# Patient Record
Sex: Male | Born: 1990 | Race: Black or African American | Hispanic: No | Marital: Single | Smoking: Former smoker
Health system: Southern US, Community
[De-identification: ages and names within clinical notes are randomized; demographics above are authoritative.]

---

## 2011-03-24 ENCOUNTER — Emergency Department (HOSPITAL_COMMUNITY): Payer: 59

## 2011-03-24 ENCOUNTER — Encounter (HOSPITAL_COMMUNITY): Payer: Self-pay | Admitting: Emergency Medicine

## 2011-03-24 ENCOUNTER — Emergency Department (HOSPITAL_COMMUNITY)
Admission: EM | Admit: 2011-03-24 | Discharge: 2011-03-24 | Disposition: A | Discharge status: Home Health | Payer: 59 | Attending: Emergency Medicine | Admitting: Emergency Medicine

## 2011-03-24 DIAGNOSIS — M25476 Effusion, unspecified foot: Secondary | ICD-10-CM | POA: Insufficient documentation

## 2011-03-24 DIAGNOSIS — Y9367 Activity, basketball: Secondary | ICD-10-CM | POA: Insufficient documentation

## 2011-03-24 DIAGNOSIS — M25473 Effusion, unspecified ankle: Secondary | ICD-10-CM | POA: Insufficient documentation

## 2011-03-24 DIAGNOSIS — X500XXA Overexertion from strenuous movement or load, initial encounter: Secondary | ICD-10-CM | POA: Insufficient documentation

## 2011-03-24 DIAGNOSIS — M25579 Pain in unspecified ankle and joints of unspecified foot: Secondary | ICD-10-CM | POA: Insufficient documentation

## 2011-03-24 DIAGNOSIS — R269 Unspecified abnormalities of gait and mobility: Secondary | ICD-10-CM | POA: Insufficient documentation

## 2011-03-24 DIAGNOSIS — S93409A Sprain of unspecified ligament of unspecified ankle, initial encounter: Secondary | ICD-10-CM | POA: Insufficient documentation

## 2011-03-24 MED ORDER — HYDROCODONE-ACETAMINOPHEN 5-325 MG PO TABS: 1.0000 | ORAL_TABLET | Freq: Four times a day (QID) | ORAL | Status: AC | PRN

## 2011-03-24 MED ORDER — HYDROCODONE-ACETAMINOPHEN 5-325 MG PO TABS
1.0000 | ORAL_TABLET | Freq: Once | ORAL | Status: AC
  Administered 2011-03-24: 1 via ORAL
  Filled 2011-03-24: qty 1

## 2011-03-24 MED ORDER — NAPROXEN 375 MG PO TABS: 375.0000 mg | ORAL_TABLET | Freq: Two times a day (BID) | ORAL | Status: AC

## 2011-03-24 MED ORDER — IBUPROFEN 800 MG PO TABS: 800.0000 mg | ORAL_TABLET | Freq: Once | ORAL | Status: DC

## 2011-03-24 MED ORDER — IBUPROFEN 800 MG PO TABS
800.0000 mg | ORAL_TABLET | Freq: Once | ORAL | Status: AC
  Administered 2011-03-24: 800 mg via ORAL
  Filled 2011-03-24: qty 1

## 2011-03-24 MED ORDER — NAPROXEN 375 MG PO TABS: 375.0000 mg | ORAL_TABLET | Freq: Two times a day (BID) | ORAL | Status: DC

## 2011-03-24 NOTE — Discharge Instructions (Signed)
Be sure to read and understand instructions below prior to leaving the hospital. If your symptoms persist without any improvement in 1 week it is reccommended that you follow up with orthopedics listed above. Use your pain medication as prescribed and do not operate heavy machinery while on pain medication. Note that your pain medication contains acetaminophen (Tylenol) & its is not reccommended that you use additional acetaminophen (Tylenol) while taking this medication.  Ankle Sprain  An ankle sprain is an injury to the ligaments that hold the ankle joint together. Your X-ray today showed no evidence of fracture, however keep all follow-up appointments with an orthopedic specialist to have follow-up X-rays, because as we discussed fractures may not appear until 3 days after the acute injury.    TREATMENT  Rest, ice, elevation, and compression are the basic modes of treatment.    HOME CARE INSTRUCTIONS  Apply ice to the sore area for 15 to 20 minutes, 3 to 4 times per day. Do this while you are awake for the first 2 days, or as directed. This can be stopped when the swelling goes away. Put the ice in a plastic bag and place a towel between the bag of ice and your skin.  Keep your leg elevated when possible to lessen swelling.  If your caregiver recommends crutches, use them as instructed for 1 week. Then, you may walk on your ankle weight bearing as tolerated.  You may take off your ankle stabilizer at night and to take a shower or bath. Wiggle your toes in the splint several times per day if you are able.  Do not drive a vehicle on pain medication. ACTIVITY:            - Weight bearing as tolerated            - Exercises should be limited to pain free range of motion            - Can start mobilization by tracing the alphabet with your foot in the air.       SEEK MEDICAL CARE IF:  You have an increase in bruising, swelling, or pain.  Your toes feel cold.  Pain relief is not achieved with  medications.  EMERGENCY:: Your toes are numb or blue or you have severe pain.  MAKE SURE YOU:  Understand these instructions.  Will watch your condition.  Will get help right away if you are not doing well or get worse   COLD THERAPY DIRECTIONS:  Ice or gel packs can be used to reduce both pain and swelling. Ice is the most helpful within the first 24 to 48 hours after an injury or flareup from overusing a muscle or joint.  Ice is effective, has very few side effects, and is safe for most people to use.   If you expose your skin to cold temperatures for too long or without the proper protection, you can damage your skin or nerves. Watch for signs of skin damage due to cold.   HOME CARE INSTRUCTIONS  Follow these tips to use ice and cold packs safely.  Place a dry or damp towel between the ice and skin. A damp towel will cool the skin more quickly, so you may need to shorten the time that the ice is used.  For a more rapid response, add gentle compression to the ice.  Ice for no more than 10 to 20 minutes at a time. The bonier the area you are icing, the less   time it will take to get the benefits of ice.  Check your skin after 5 minutes to make sure there are no signs of a poor response to cold or skin damage.  Rest 20 minutes or more in between uses.  Once your skin is numb, you can end your treatment. You can test numbness by very lightly touching your skin. The touch should be so light that you do not see the skin dimple from the pressure of your fingertip. When using ice, most people will feel these normal sensations in this order: cold, burning, aching, and numbness.  Do not use ice on someone who cannot communicate their responses to pain, such as small children or people with dementia.   HOW TO MAKE AN ICE PACK  To make an ice pack, do one of the following:  Place crushed ice or a bag of frozen vegetables in a sealable plastic bag. Squeeze out the excess air. Place this bag inside  another plastic bag. Slide the bag into a pillowcase or place a damp towel between your skin and the bag.  Mix 3 parts water with 1 part rubbing alcohol. Freeze the mixture in a sealable plastic bag. When you remove the mixture from the freezer, it will be slushy. Squeeze out the excess air. Place this bag inside another plastic bag. Slide the bag into a pillowcase or place a damp towel between your sk 

## 2011-03-24 NOTE — ED Notes (Signed)
Pt states that he came down on his ankle while playing basketball today and thinks he may have injured it

## 2011-03-24 NOTE — ED Provider Notes (Signed)
History     CSN: [id number]  Arrival date & time 03/24/11  1902   First MD Initiated Contact with Patient 03/24/11 2009      Chief Complaint  Patient presents with  . Ankle Injury    (Consider location/radiation/quality/duration/timing/severity/associated sxs/prior treatment) HPI Comments: Patient no medical history presents emergency department with chief complaint of right-sided ankle pain.  Onset occurred earlier today around 7 in the evening.  The pain is described as a throbbing, does not radiate, is rated at a 8/10 in severity and is not have any associated symptoms.  Patient states pain with weightbearing but denies numbness and tingling of the extremity.  Patient was playing basketball earlier today when he jumped for shot rolling his ankle, mechanism was inversion.  Patient is alert this time.  Patient is a 21 y.o. male presenting with lower extremity injury. The history is provided by the patient.  Ankle Injury Associated symptoms include arthralgias. Pertinent negatives include no abdominal pain, chest pain, chills, congestion, fever, headaches, numbness or weakness.    History reviewed. No pertinent past medical history.  History reviewed. No pertinent past surgical history.  No family history on file.  History  Substance Use Topics  . Smoking status: Not on file  . Smokeless tobacco: Not on file  . Alcohol Use: Not on file      Review of Systems  Constitutional: Negative for fever, chills and appetite change.  HENT: Negative for congestion.   Eyes: Negative for visual disturbance.  Respiratory: Negative for shortness of breath.   Cardiovascular: Negative for chest pain and leg swelling.  Gastrointestinal: Negative for abdominal pain.  Genitourinary: Negative for dysuria, urgency and frequency.  Musculoskeletal: Positive for arthralgias and gait problem.  Neurological: Negative for dizziness, syncope, weakness, light-headedness, numbness and headaches.    Psychiatric/Behavioral: Negative for confusion.    Allergies  Review of patient's allergies indicates no known allergies.  Home Medications  No current outpatient prescriptions on file.  BP 158/69  Pulse 76  Temp 98.8 F (37.1 C)  Resp 18  SpO2 100%  Physical Exam  Nursing note and vitals reviewed. Constitutional: He is oriented to person, place, and time. He appears well-developed and well-nourished. No distress.  HENT:  Head: Normocephalic and atraumatic.  Eyes: Conjunctivae and EOM are normal.  Neck: Normal range of motion.  Pulmonary/Chest: Effort normal.  Musculoskeletal:       Right ankle: He exhibits decreased range of motion and swelling. He exhibits no ecchymosis, no laceration and normal pulse. tenderness. Lateral malleolus and medial malleolus tenderness found. No AITFL, no CF ligament, no posterior TFL, no head of 5th metatarsal and no proximal fibula tenderness found. Achilles tendon normal.       Feet:       Intact distal pulses, decreased range of motion of right ankle due to pain.  No pain with passive inversion or eversion of foot.  Tenderness to palpation along the medial malleolus and posterior lateral malleolus.  Mild swelling, no contusions.  Neurological: He is alert and oriented to person, place, and time.  Skin: Skin is warm and dry. No rash noted. He is not diaphoretic.  Psychiatric: He has a normal mood and affect. His behavior is normal.    ED Course  Procedures (including critical care time)  Labs Reviewed - No data to display Dg Ankle Complete Right  03/24/2011  *RADIOLOGY REPORT*  Clinical Data: Twisted with medial pain  RIGHT ANKLE - COMPLETE 3+ VIEW  Comparison: None.  Findings:  There is a joint effusion.  No evidence of fracture or dislocation.  IMPRESSION: No bony abnormality.  Joint effusion.  Original Report Authenticated By: Thomasenia Sales, M.D.     No diagnosis found.    MDM  Ankle sprain  Patient X-Ray negative for obvious  fracture or dislocation. Pain managed in ED. Pt advised to follow up with orthopedics if symptoms persist for possibility of missed fracture diagnosis. Patient given brace while in ED, conservative therapy recommended and discussed. Patient will be dc home & is agreeable with above plan.         Jaci Carrel, [place] 03/24/11 2038

## 2011-04-09 NOTE — ED Provider Notes (Signed)
Medical screening examination/treatment/procedure(s) were performed by non-physician practitioner and as supervising physician I was immediately available for consultation/collaboration.  Raeford Razor, MD 04/09/11 [phone]

## 2015-05-29 ENCOUNTER — Emergency Department (HOSPITAL_BASED_OUTPATIENT_CLINIC_OR_DEPARTMENT_OTHER): Payer: BLUE CROSS/BLUE SHIELD

## 2015-05-29 ENCOUNTER — Encounter (HOSPITAL_BASED_OUTPATIENT_CLINIC_OR_DEPARTMENT_OTHER): Payer: Self-pay

## 2015-05-29 ENCOUNTER — Emergency Department (HOSPITAL_BASED_OUTPATIENT_CLINIC_OR_DEPARTMENT_OTHER)
Admission: EM | Admit: 2015-05-29 | Discharge: 2015-05-29 | Disposition: A | Discharge status: Home / Self Care | Payer: BLUE CROSS/BLUE SHIELD | Attending: Emergency Medicine | Admitting: Emergency Medicine

## 2015-05-29 DIAGNOSIS — M25532 Pain in left wrist: Secondary | ICD-10-CM | POA: Diagnosis not present

## 2015-05-29 DIAGNOSIS — Z87891 Personal history of nicotine dependence: Secondary | ICD-10-CM | POA: Diagnosis not present

## 2015-05-29 NOTE — ED Provider Notes (Signed)
CSN: [id number]     Arrival date & time 05/29/15  1307 History   First MD Initiated Contact with Patient 05/29/15 1337     Chief Complaint  Patient presents with  . Wrist Injury     Patient is a 25 y.o. male presenting with wrist injury. The history is provided by the patient. No language interpreter was used.  Wrist Injury  Timothy Diaz is a 25 y.o. male who presents to the Emergency Department complaining of wrist pain.  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 He reports 1 month of waxing and waning left wrist pain. He has occasional pain to his left dorsal wrist. Over the last week he's noticed a bump on the back of his wrist. No fevers, injuries, numbness, weakness. Symptoms are mild and worsening in nature.  History reviewed. No pertinent past medical history. History reviewed. No pertinent past surgical history. No family history on file. Social History  Substance Use Topics  . Smoking status: Former Games developermoker  . Smokeless tobacco: None  . Alcohol Use: No    Review of Systems  All other systems reviewed and are negative.     Allergies  Review of patient's allergies indicates no known allergies.  Home Medications   Prior to Admission medications   Not on File   BP 151/60 mmHg  Pulse 64  Temp(Src) 97.9 F (36.6 C) (Oral)  Resp 16  Ht 6\' 2"  (1.88 m)  Wt 210 lb (95.255 kg)  BMI 26.95 kg/m2  SpO2 100% Physical Exam  Constitutional: He is oriented to person, place, and time. He appears well-developed and well-nourished. No distress.  HENT:  Head: Normocephalic and atraumatic.  Cardiovascular: Normal rate.   Pulmonary/Chest: Effort normal. No respiratory distress.  Musculoskeletal:  2+ radial pulses. There is a firm nodule on the left dorsal wrist that is prominent with flexion of the wrists. No erythema or significant tenderness to the wrist. Full range of motion throughout the wrist and hands.  Neurological: He is alert and oriented  to person, place, and time.  5 out of 5 grip strength bilaterally with sensation to light touch intact throughout bilateral upper extremities  Skin: Skin is warm and dry.  Psychiatric: He has a normal mood and affect. His behavior is normal.  Nursing note and vitals reviewed.   ED Course  Procedures (including critical care time) Labs Review Labs Reviewed - No data to display  Imaging Review Dg Wrist Complete Left  05/29/2015  CLINICAL DATA:  Knot on posterior wrist for a month, no known injury. Intermittent pain. EXAM: LEFT WRIST - COMPLETE 3+ VIEW COMPARISON:  None. FINDINGS: Osseous structures of the left wrist are normally aligned. Bone mineralization is normal. No acute or suspicious osseous lesion. No fracture line or displaced fracture fragment. Adjacent soft tissues are unremarkable. IMPRESSION: Negative. Electronically Signed   By: Bary RichardStan  Maynard M.D.   On: 05/29/2015 13:43   I have personally reviewed and evaluated these images and lab results as part of my medical decision-making.   EKG Interpretation None      MDM   Final diagnoses:  Left wrist pain  Patient here for evaluation of left wrist pain for the last week. No evidence of acute fracture, dislocation, infection. Patient with likely overuse injury from repetitive work. Will provide splint to use while working to rest his wrist. Discussed ibuprofen, home care, outpatient follow-up, return precautions.  Tilden FossaElizabeth Alexee Delsanto, MD 05/29/15 [phone]

## 2015-05-29 NOTE — ED Notes (Signed)
Pt reports pain to L wrist without injury x 1 week.

## 2015-05-29 NOTE — Discharge Instructions (Signed)
You may use the removable wrist splint while at work. You can take ibuprofen, available over-the-counter as needed for pain.   Joint Pain Joint pain, which is also called arthralgia, can be caused by many things. Joint pain often goes away when you follow your health care provider's instructions for relieving pain at home. However, joint pain can also be caused by conditions that require further treatment. Common causes of joint pain include:  Bruising in the area of the joint.  Overuse of the joint.  Wear and tear on the joints that occur with aging (osteoarthritis).  Various other forms of arthritis.  A buildup of a crystal form of uric acid in the joint (gout).  Infections of the joint (septic arthritis) or of the bone (osteomyelitis). Your health care provider may recommend medicine to help with the pain. If your joint pain continues, additional tests may be needed to diagnose your condition. HOME CARE INSTRUCTIONS Watch your condition for any changes. Follow these instructions as directed to lessen the pain that you are feeling.  Take medicines only as directed by your health care provider.  Rest the affected area for as long as your health care provider says that you should. If directed to do so, raise the painful joint above the level of your heart while you are sitting or lying down.  Do not do things that cause or worsen pain.  If directed, apply ice to the painful area:  Put ice in a plastic bag.  Place a towel between your skin and the bag.  Leave the ice on for 20 minutes, 2-3 times per day.  Wear an elastic bandage, splint, or sling as directed by your health care provider. Loosen the elastic bandage or splint if your fingers or toes become numb and tingle, or if they turn cold and blue.  Begin exercising or stretching the affected area as directed by your health care provider. Ask your health care provider what types of exercise are safe for you.  Keep all  follow-up visits as directed by your health care provider. This is important. SEEK MEDICAL CARE IF:  Your pain increases, and medicine does not help.  Your joint pain does not improve within 3 days.  You have increased bruising or swelling.  You have a fever.  You lose 10 lb (4.5 kg) or more without trying. SEEK IMMEDIATE MEDICAL CARE IF:  You are not able to move the joint.  Your fingers or toes become numb or they turn cold and blue.   This information is not intended to replace advice given to you by your health care provider. Make sure you discuss any questions you have with your health care provider.   Document Released: 12/21/2004 Document Revised: 01/11/2014 Document Reviewed: 10/02/2013 Elsevier Interactive Patient Education [employer]2016 [employer]

## 2017-03-04 IMAGING — DX DG WRIST COMPLETE 3+V*L*
4 series · 4 of 4 positions shown · non-contrast
Comparison: None.

CLINICAL DATA: Knot on posterior wrist for a month, no known
injury. Intermittent pain.

EXAM:
LEFT WRIST - COMPLETE 3+ VIEW

[wrist pa]
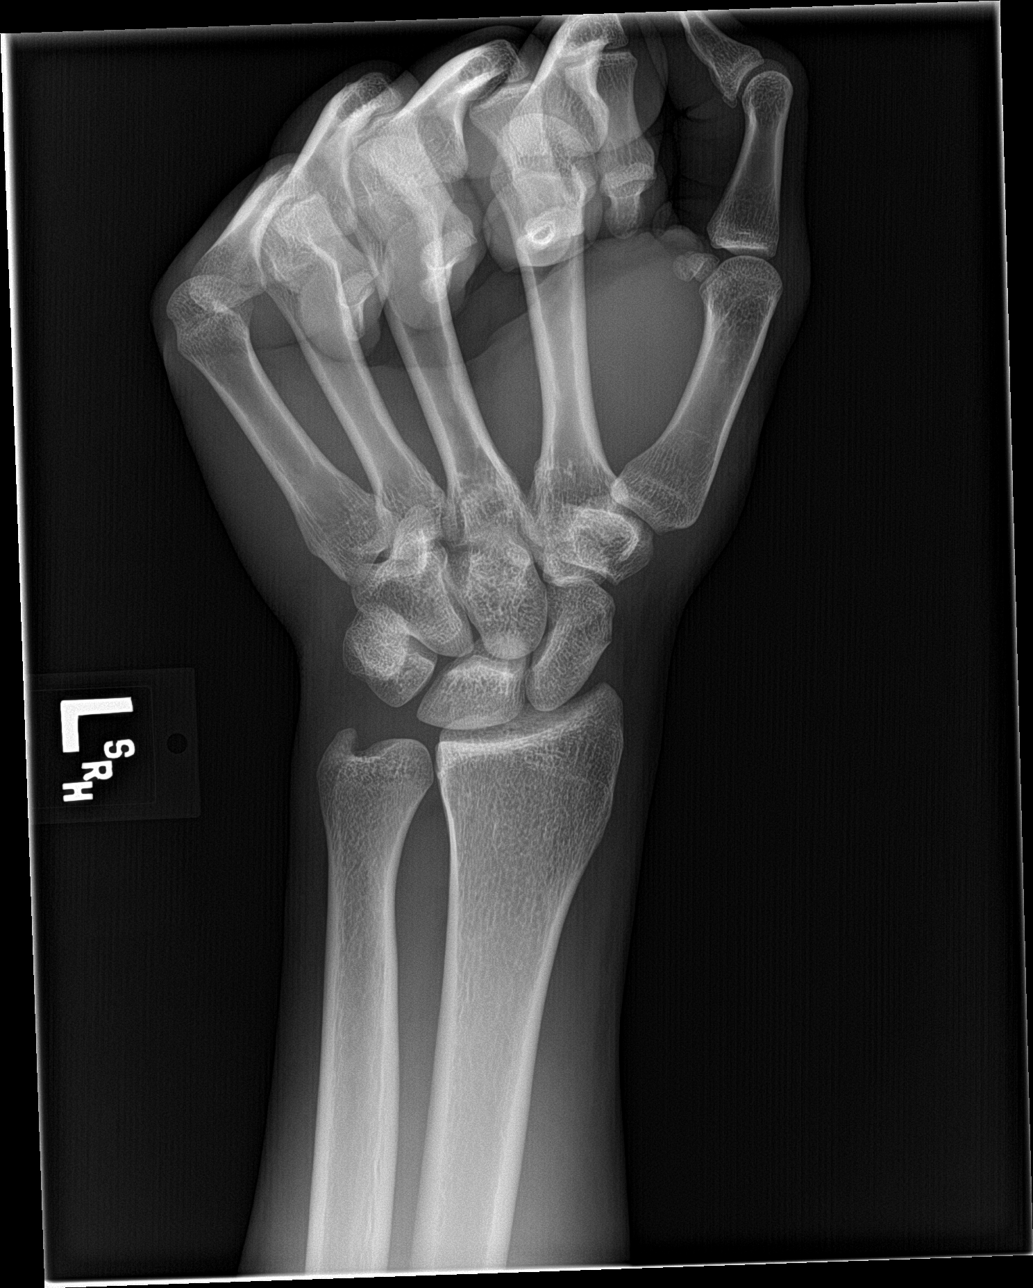

[wrist obl]
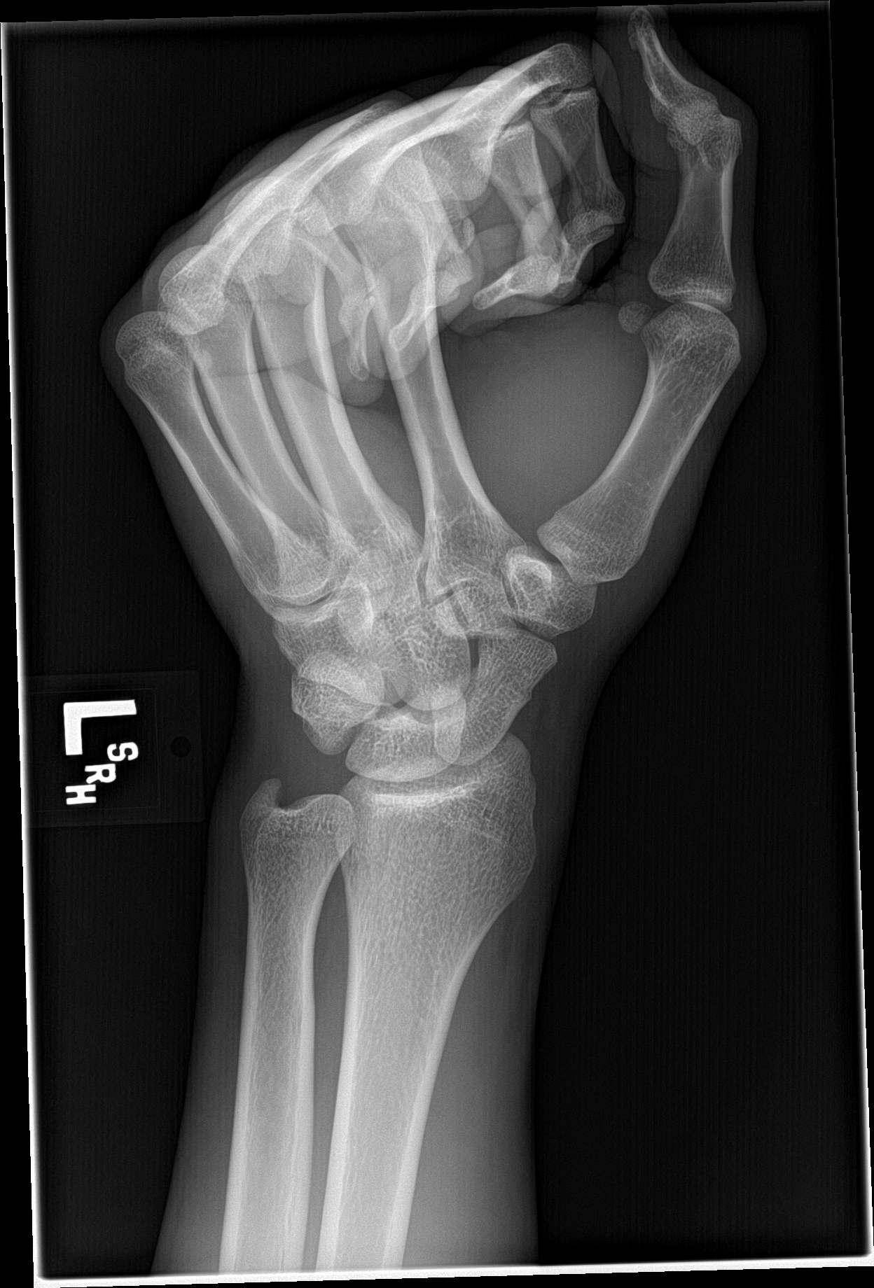

[wrist lat]
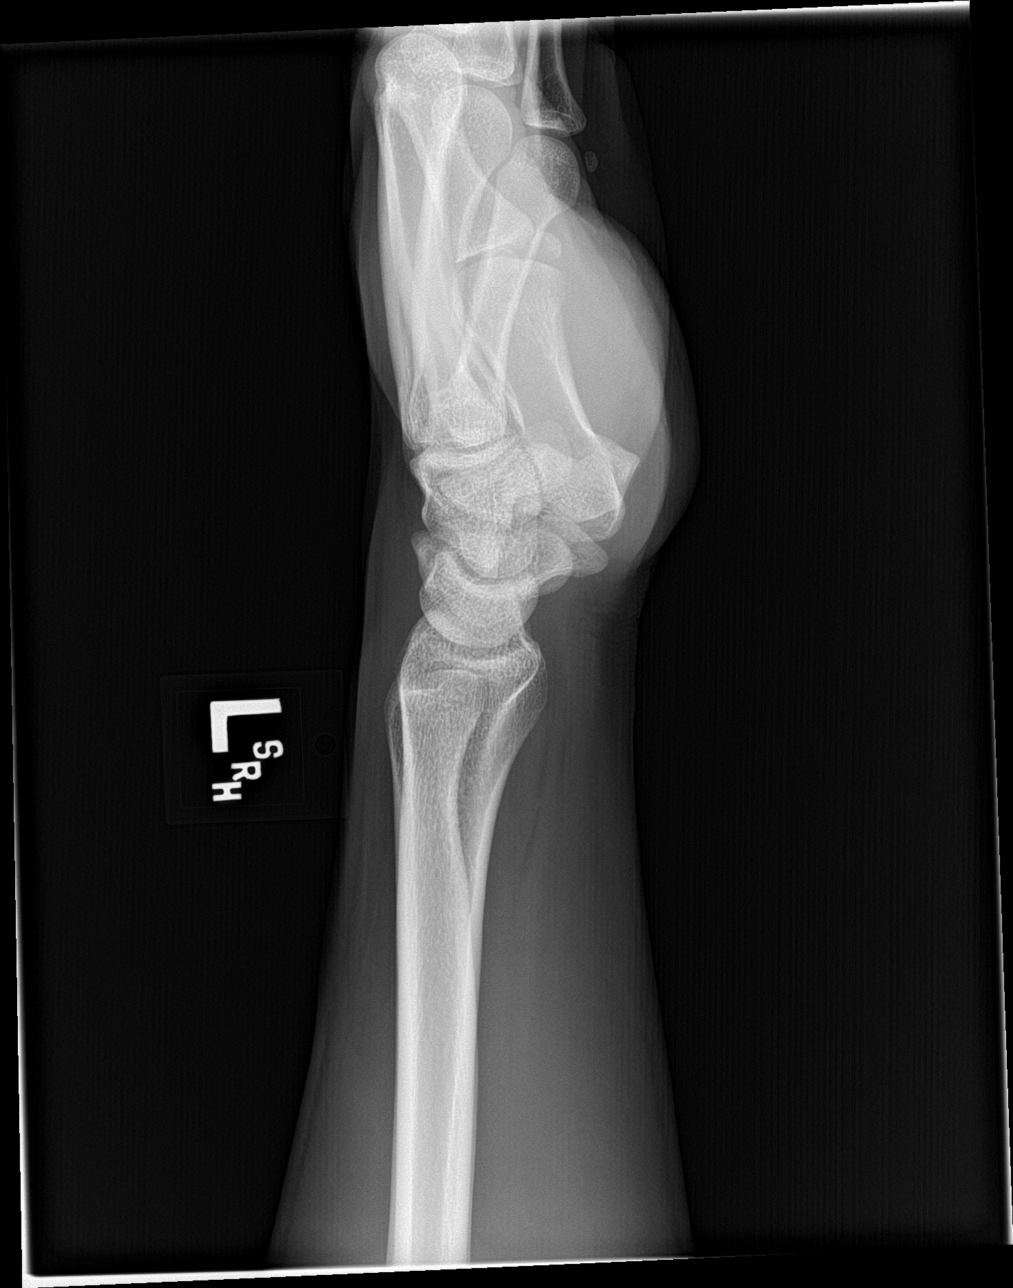

[wrist navicular]
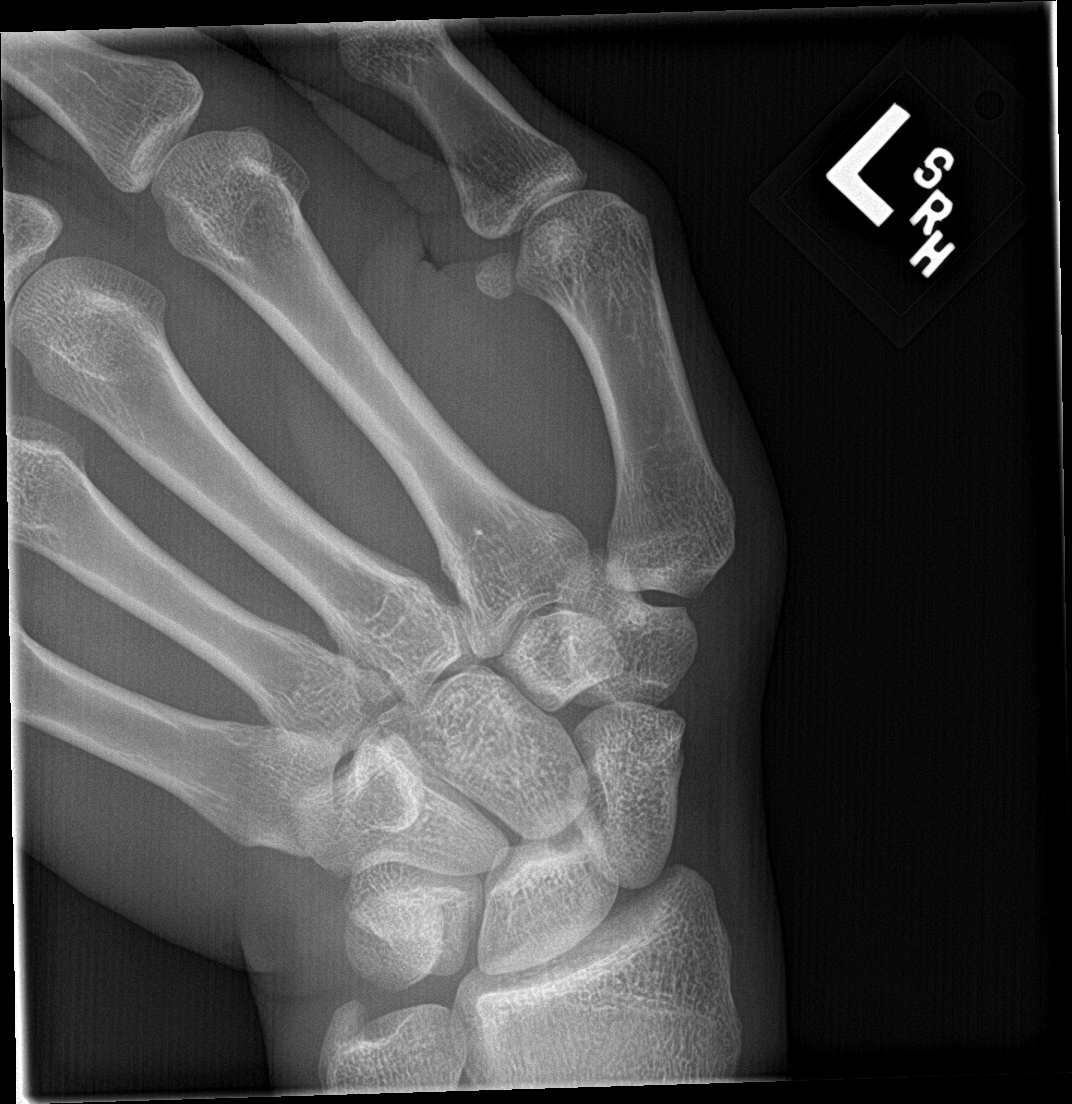

[4 of 4 positions shown; findings below may reference images not displayed]

FINDINGS: Osseous structures of the left wrist are normally aligned. Bone
mineralization is normal. No acute or suspicious osseous lesion. No
fracture line or displaced fracture fragment. Adjacent soft tissues
are unremarkable.
IMPRESSION: Negative.
